# Patient Record
Sex: Male | Born: 1965 | Race: Black or African American | Hispanic: No | Marital: Married | Smoking: Never smoker
Health system: Southern US, Community
[De-identification: ages and names within clinical notes are randomized; demographics above are authoritative.]

## PROBLEM LIST (undated history)

## (undated) DIAGNOSIS — E7800 Pure hypercholesterolemia, unspecified: Secondary | ICD-10-CM

---

## 2015-03-20 ENCOUNTER — Emergency Department (HOSPITAL_COMMUNITY)
Admission: EM | Admit: 2015-03-20 | Discharge: 2015-03-20 | Disposition: A | Discharge status: Home / Self Care | Payer: BLUE CROSS/BLUE SHIELD | Attending: Emergency Medicine | Admitting: Emergency Medicine

## 2015-03-20 ENCOUNTER — Emergency Department (HOSPITAL_COMMUNITY)
Admit: 2015-03-20 | Discharge: 2015-03-20 | Disposition: A | Discharge status: Home / Self Care | Payer: BLUE CROSS/BLUE SHIELD | Attending: Emergency Medicine | Admitting: Emergency Medicine

## 2015-03-20 ENCOUNTER — Emergency Department (HOSPITAL_BASED_OUTPATIENT_CLINIC_OR_DEPARTMENT_OTHER)
Admit: 2015-03-20 | Discharge: 2015-03-20 | Disposition: A | Discharge status: Home / Self Care | Payer: BLUE CROSS/BLUE SHIELD | Attending: Emergency Medicine | Admitting: Emergency Medicine

## 2015-03-20 ENCOUNTER — Emergency Department (HOSPITAL_COMMUNITY): Payer: BLUE CROSS/BLUE SHIELD

## 2015-03-20 ENCOUNTER — Encounter (HOSPITAL_COMMUNITY): Payer: Self-pay | Admitting: Emergency Medicine

## 2015-03-20 DIAGNOSIS — R2232 Localized swelling, mass and lump, left upper limb: Secondary | ICD-10-CM | POA: Diagnosis not present

## 2015-03-20 DIAGNOSIS — E782 Mixed hyperlipidemia: Secondary | ICD-10-CM | POA: Diagnosis not present

## 2015-03-20 DIAGNOSIS — Z79899 Other long term (current) drug therapy: Secondary | ICD-10-CM | POA: Diagnosis not present

## 2015-03-20 DIAGNOSIS — M7989 Other specified soft tissue disorders: Secondary | ICD-10-CM | POA: Diagnosis not present

## 2015-03-20 DIAGNOSIS — R079 Chest pain, unspecified: Secondary | ICD-10-CM | POA: Diagnosis not present

## 2015-03-20 HISTORY — DX: Pure hypercholesterolemia, unspecified: E78.00

## 2015-03-20 LAB — BASIC METABOLIC PANEL
ANION GAP: 7 (ref 5–15)
BUN: 8 mg/dL (ref 6–20)
CHLORIDE: 104 mmol/L (ref 101–111)
CO2: 27 mmol/L (ref 22–32)
Calcium: 8.9 mg/dL (ref 8.9–10.3)
Creatinine, Ser: 1.23 mg/dL (ref 0.61–1.24)
GFR calc Af Amer: >60 mL/min (ref >60)
GFR calc non Af Amer: >60 mL/min (ref >60)
GLUCOSE: 105 mg/dL — AB (ref 65–99)
Potassium: 3.6 mmol/L (ref 3.5–5.1)
Sodium: 138 mmol/L (ref 135–145)

## 2015-03-20 LAB — CBC
HCT: 41.3 % (ref 39.0–52.0)
HEMOGLOBIN: 14.1 g/dL (ref 13.0–17.0)
MCH: 29.7 pg (ref 26.0–34.0)
MCHC: 34.1 g/dL (ref 30.0–36.0)
MCV: 87.1 fL (ref 78.0–100.0)
PLATELETS: 239 10*3/uL (ref 150–400)
RBC: 4.74 MIL/uL (ref 4.22–5.81)
RDW: 13.3 % (ref 11.5–15.5)
WBC: 2.7 10*3/uL — ABNORMAL LOW (ref 4.0–10.5)

## 2015-03-20 LAB — I-STAT TROPONIN, ED: Troponin i, poc: 0 ng/mL (ref 0.00–0.08)

## 2015-03-20 LAB — D-DIMER, QUANTITATIVE (NOT AT ARMC): D-Dimer, Quant: 0.5 ug/mL-FEU — ABNORMAL HIGH (ref 0.00–0.48)

## 2015-03-20 MED ORDER — IOHEXOL 350 MG/ML SOLN
100.0000 mL | Freq: Once | INTRAVENOUS | Status: AC | PRN
  Administered 2015-03-20: 70 mL via INTRAVENOUS

## 2015-03-20 NOTE — ED Provider Notes (Signed)
PROGRESS NOTE                                                                                                                 This is a sign-out fromPA hedges at shift change: Micheal Tanner is a 49 y.o. male presenting with left arm swelling and chest pain. Plan is to follow up CT to rule out PE. Patient had vascular ultrasounds of the left upper extremity and right lower extremity with no DVT, his d-dimer is elevated. Please refer to previous note for full HPI, ROS, PMH and PE.   CT is negative, patient updated on results of care plan, advised him to follow closely with PCP.  Filed Vitals:   03/20/15 1300 03/20/15 1311 03/20/15 1443 03/20/15 1600  BP:   136/90 147/90  Pulse: 73 74 77 68  Temp:      TempSrc:      Resp: Height:      Weight:      SpO2: 99% 98% 98% 99%    Medications  iohexol (OMNIPAQUE) 350 MG/ML injection 100 mL (70 mLs Intravenous Contrast Given 03/20/15 1731)     Evaluation does not show pathology that would require ongoing emergent intervention or inpatient treatment. Pt is hemodynamically stable and mentating appropriately. Discussed findings and plan with patient/guardian, who agrees with care plan. All questions answered. Return precautions discussed and outpatient follow up given.        Wynetta Emery, PA-C 03/20/15 1946  Pricilla Loveless, MD 03/22/15 [phone]

## 2015-03-20 NOTE — Discharge Instructions (Signed)
Peripheral Edema You have swelling in your legs (peripheral edema). This swelling is due to excess accumulation of salt and water in your body. Edema may be a sign of heart, kidney or liver disease, or a side effect of a medication. It may also be due to problems in the leg veins. Elevating your legs and using special support stockings may be very helpful, if the cause of the swelling is due to poor venous circulation. Avoid long periods of standing, whatever the cause. Treatment of edema depends on identifying the cause. Chips, pretzels, pickles and other salty foods should be avoided. Restricting salt in your diet is almost always needed. Water pills (diuretics) are often used to remove the excess salt and water from your body via urine. These medicines prevent the kidney from reabsorbing sodium. This increases urine flow. Diuretic treatment may also result in lowering of potassium levels in your body. Potassium supplements may be needed if you have to use diuretics daily. Daily weights can help you keep track of your progress in clearing your edema. You should call your caregiver for follow up care as recommended. SEEK IMMEDIATE MEDICAL CARE IF:   You have increased swelling, pain, redness, or heat in your legs.  You develop shortness of breath, especially when lying down.  You develop chest or abdominal pain, weakness, or fainting.  You have a fever. Document Released: 09/11/2004 Document Revised: 10/27/2011 Document Reviewed: 08/22/2009 [facility] Patient Information 2015 [place], [place]. This information is not intended to replace advice given to you by your health care provider. Make sure you discuss any questions you have with your health care provider.  Please monitor for new or worsening signs or symptoms, return immediately if any present. Please follow-up with her primary care provider or orthopedist for further evaluation if symptoms continue to persist.

## 2015-03-20 NOTE — Progress Notes (Signed)
*  Preliminary Results* Left upper extremity venous duplex completed. Left upper extremity is negative for deep and superficial vein thrombosis.   Right lower extremity venous duplex completed. Right lower extremity is negative for deep vein thrombosis. There is no evidence of right Baker's cyst.  03/20/2015 2:32 PM  Gertie Fey, RVT, RDCS, RDMS

## 2015-03-20 NOTE — ED Provider Notes (Signed)
CSN: [id number]     Arrival date & time 03/20/15  1036 History   First MD Initiated Contact with Patient 03/20/15 1051     Chief Complaint  Patient presents with  . Chest Pain  . Arm Swelling   HPI   49 year old male presents today with left upper extremity swelling, and chest pain. Patient reports symptoms began on Sunday, no known causative event. Patient reports the arm has been calm swollen greater than the right, and has an associated chest pain. He describes the chest pain as pressure in the left anterior chest, not made worse by movement, no diaphoresis, no nausea, no vomiting, no shortness of breath. Denies any history of the same. Patient reports that he has no history of DVT, PE, does not smoke, has not had prolonged immobilization, no steroid or estrogen use, no history of malignancy, no recent surgeries. Patient denies any significant cardiac history, reports he does have high cholesterol taking medication for this, no hypertension, no diabetes, nonsmoker. Patient does report that his father had a heart attack at an early age, reports that he was a smoker, hypertensive.     Past Medical History  Diagnosis Date  . High cholesterol    History reviewed. No pertinent past surgical history. No family history on file. History  Substance Use Topics  . Smoking status: Never Smoker   . Smokeless tobacco: Not on file  . Alcohol Use: Yes     Comment: occassionally    Review of Systems  All other systems reviewed and are negative.  Allergies  Review of patient's allergies indicates no known allergies.  Home Medications   Prior to Admission medications   Medication Sig Start Date End Date Taking? Authorizing Provider  atorvastatin (LIPITOR) 20 MG tablet Take 20 mg by mouth daily.   Yes Historical Provider, MD  cetirizine (ZYRTEC) 10 MG tablet Take 10 mg by mouth as needed for allergies.   Yes Historical Provider, MD  naproxen sodium (ANAPROX) 220 MG tablet Take 440 mg by mouth as  needed (pain).   Yes Historical Provider, MD   BP 147/90 mmHg  Pulse 68  Temp(Src) 98.7 F (37.1 C) (Oral)  Resp 12  Ht 6\' 2"  (1.88 m)  Wt 200 lb (90.719 kg)  BMI 25.67 kg/m2  SpO2 99%   Physical Exam  Constitutional: He is oriented to person, place, and time. He appears well-developed and well-nourished.  HENT:  Head: Normocephalic and atraumatic.  Eyes: Conjunctivae are normal. Pupils are equal, round, and reactive to light. Right eye exhibits no discharge. Left eye exhibits no discharge. No scleral icterus.  Neck: Normal range of motion. Neck supple. No JVD present. No tracheal deviation present.  Cardiovascular: Normal rate, regular rhythm, normal heart sounds and intact distal pulses.  Exam reveals no gallop and no friction rub.   No murmur heard. Pulmonary/Chest: Effort normal and breath sounds normal. No stridor. No respiratory distress. He has no wheezes. He has no rales. He exhibits no tenderness.  Musculoskeletal: He exhibits edema.  Left upper extremity slightly larger than the right with edema. Patient has nontender to palpation, full active range of motion of the shoulder or elbow and wrist, pain free, Refill less than 3 seconds, radial pulses 2+, sensation grossly intact.  Tenderness to palpation of the right hamstring, no obvious swelling.  Bilateral lower extremities equal, no swelling or edema, sensation grossly intact.  Neurological: He is alert and oriented to person, place, and time. Coordination normal.  Skin: Skin is warm  and dry.  Psychiatric: He has a normal mood and affect. His behavior is normal. Judgment and thought content normal.  Nursing note and vitals reviewed.   ED Course  Procedures (including critical care time) Labs Review Labs Reviewed  BASIC METABOLIC PANEL - Abnormal; Notable for the following:    Glucose, Bld 105 (*)    All other components within normal limits  CBC - Abnormal; Notable for the following:    WBC 2.7 (*)    All other  components within normal limits  D-DIMER, QUANTITATIVE (NOT AT [facility]) - Abnormal; Notable for the following:    D-Dimer, Quant 0.50 (*)    All other components within normal limits  I-STAT TROPOININ, ED    Imaging Review Dg Chest 2 View  03/20/2015   CLINICAL DATA:  Chest pain for 3 days.  Left-sided pressure.  EXAM: CHEST - 2 VIEW  COMPARISON:  None.  FINDINGS: The heart size and mediastinal contours are within normal limits. Both lungs are clear. The visualized skeletal structures are unremarkable.  IMPRESSION: Negative two view chest x-ray   Electronically Signed   By: Marin Roberts M.D.   On: 03/20/2015 11:57     EKG Interpretation   Date/Time:  Tuesday March 20 2015 10:43:17 EDT Ventricular Rate:  83 PR Interval:  146 QRS Duration: 81 QT Interval:  353 QTC Calculation: 415 R Axis:   28 Text Interpretation:  Sinus rhythm Abnormal R-wave progression, early  transition Borderline repolarization abnormality No old tracing to compare  Confirmed by GOLDSTON  MD, SCOTT ([id number]) on 03/20/2015 11:45:59 AM      MDM   Final diagnoses:  Arm swelling    Labs: D-dimer, BMP, CBC, i-STAT troponin- d-dimer 0.50  Imaging: Venous ultrasound left upper extremity, right lower extremity, CT PE- no clots on ultrasound CT pending  Consults:  Therapeutics: Patient offered pain medication, refused  Discharge Meds:   Assessment/Plan: Patient presents with chest pain, left arm swelling. Patient's only Wells criteria positive is left upper extremity swelling, ultrasound shows no clot. Patient did have an elevated d-dimer here today, discussion of risks and benefits with both the patient and his wife with agreement to CT PE at this time. Uncertain etiology of left upper extremity swelling and edema, no signs of clot. Patient reports that he did work out approximately week ago, lifted weights and upper body, this is abnormal exercise routine for him. He denies upper musculoskeletal pain. Patient  will receive a CT scan here in the ED and signed out to  mid-level provider Joni Reining Pisciotta PA-C pending CT results. Patient reports that his chest pain has resolved after passing gas. Patient has a EKG that is not consistent with MI, negative troponin, heart score of 1, highly unlikely to be ACS. I've discussed treatment plan with the patient, if CT is negative, he will be discharged home with instructions to follow-up with his primary care provider, an orthopedist if symptoms continue to persist. Patient was given strict return precautions the event new worsening signs or symptoms present, he verbalized understanding and agreement to display splint has no further questions or concerns.      Eyvonne Mechanic, PA-C 03/20/15 1623  Eyvonne Mechanic, PA-C 03/20/15 1638  Pricilla Loveless, MD 03/22/15 [phone]

## 2015-03-20 NOTE — ED Notes (Signed)
Patient off the unit for procedure.

## 2015-03-20 NOTE — ED Notes (Signed)
Patient transported to CT 

## 2015-03-20 NOTE — ED Notes (Signed)
Cp x 3 days, first noticed at rest, left sided pressure non radiating with no associated symptoms.  Pain is rated at a 5/10 in severity,  It is constant and described as a pressure.  States it is better with sleep or rest.  Not reproduced with palpation.  Also has had right arm swelling for about the same time, as well as left hamstring pain.  Also c/o of headaches.  These all started 3 days ago.  Yesterday the patient flew here from [place].   VSS, pulses intact and equal bilaterally.

## 2016-06-29 IMAGING — CT CT ANGIO CHEST
2 of 8 series · 18 of 36 positions shown · IV contrast (Iohexol (Omnipaque 350))
Comparison: Chest x-ray 03/20/2015

CLINICAL DATA: Left arm swelling for 2-3 days. Evaluate for blood
clot.

EXAM:
CT ANGIOGRAPHY CHEST WITH CONTRAST
TECHNIQUE: Multidetector CT imaging of the chest was performed using the
standard protocol during bolus administration of intravenous
contrast. Multiplanar CT image reconstructions and MIPs were
obtained to evaluate the vascular anatomy.
CONTRAST:  70mL OMNIPAQUE IOHEXOL 350 MG/ML SOLN

[Series 402: thins pacs · axial · 0.68mm/px · z∈[-487,-213]mm · 17 of 310 slices shown]
[im 18/310  lung]
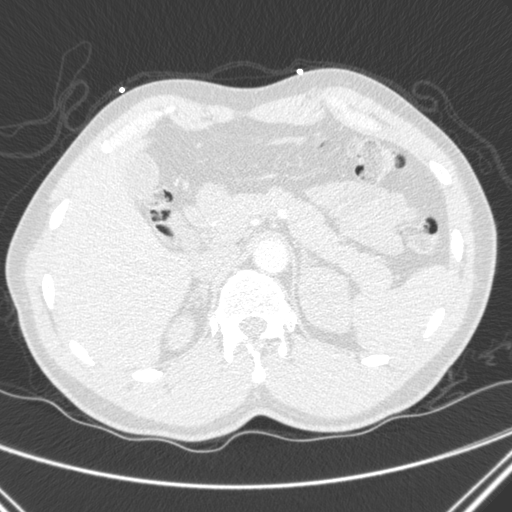
[im 35/310  mediastinal]
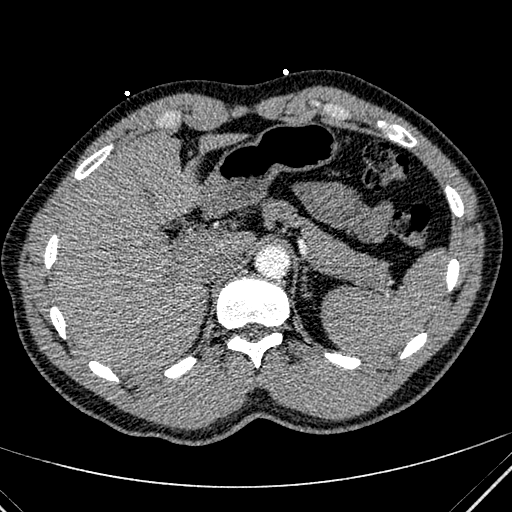
[im 52/310  lung]
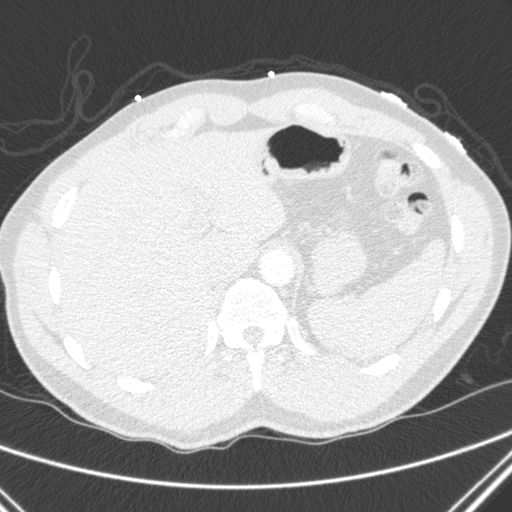
[im 69/310  mediastinal]
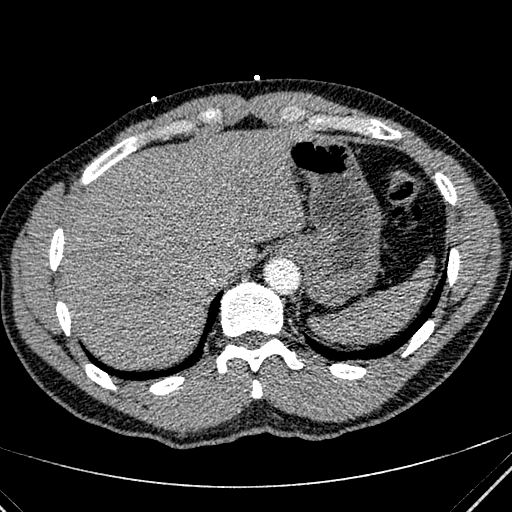
[im 86/310  lung]
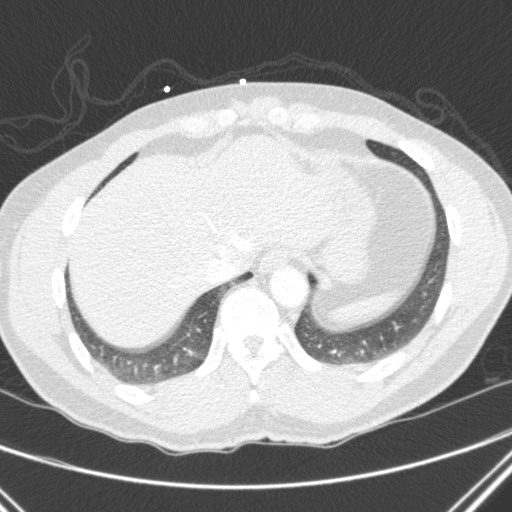
[im 104/310  mediastinal]
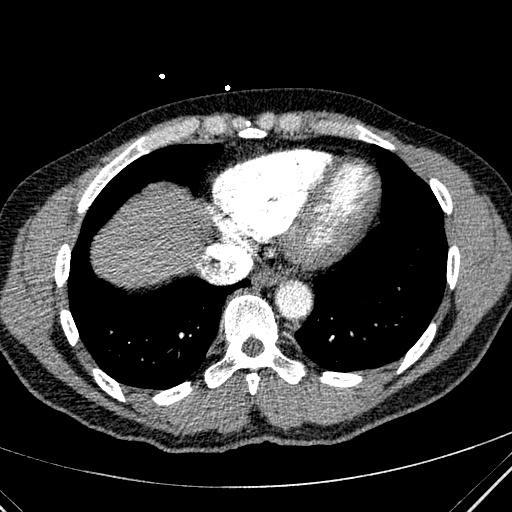
[im 121/310  lung]
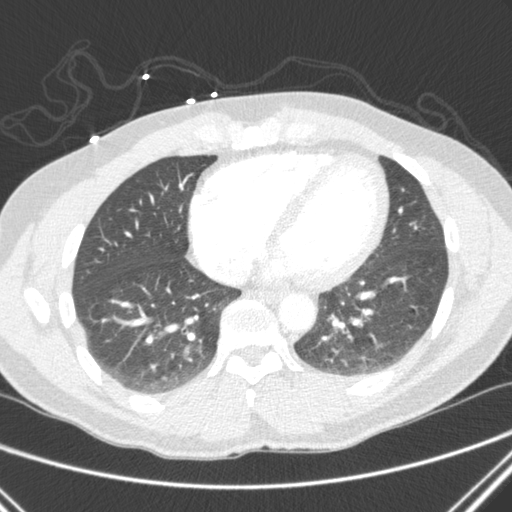
[im 138/310  mediastinal]
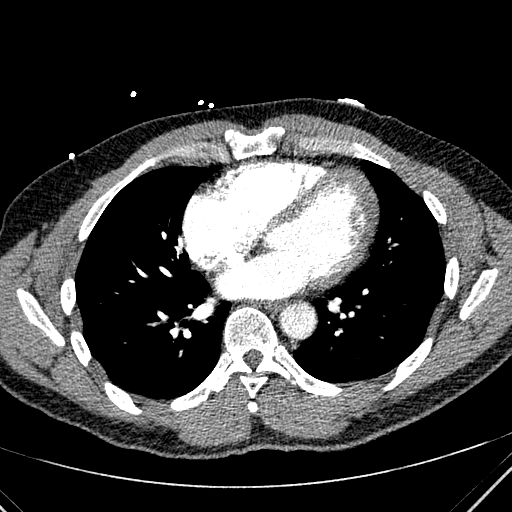
[im 155/310  lung]
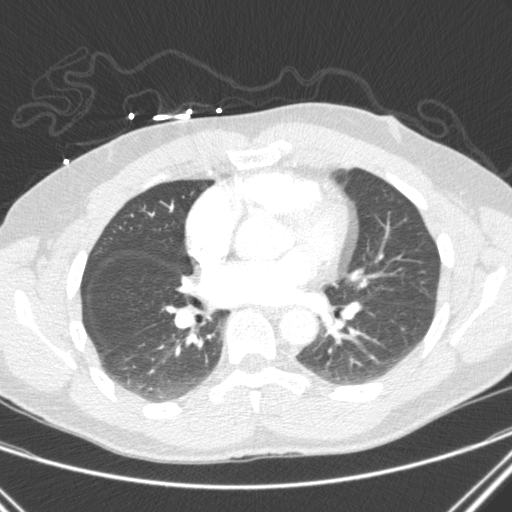
[im 172/310  mediastinal]
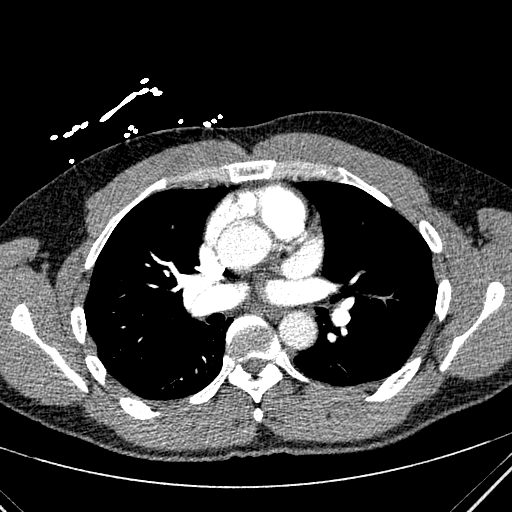
[im 189/310  lung]
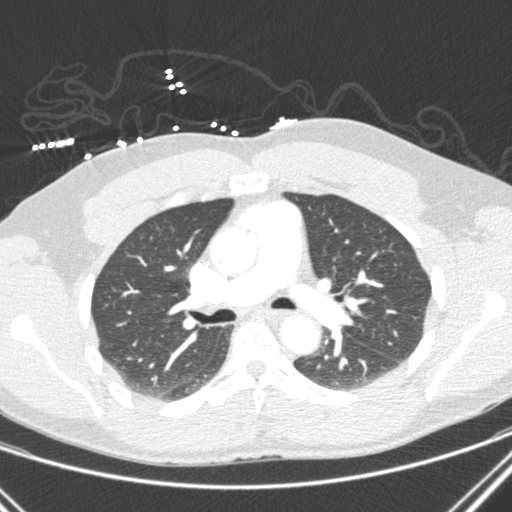
[im 207/310  mediastinal]
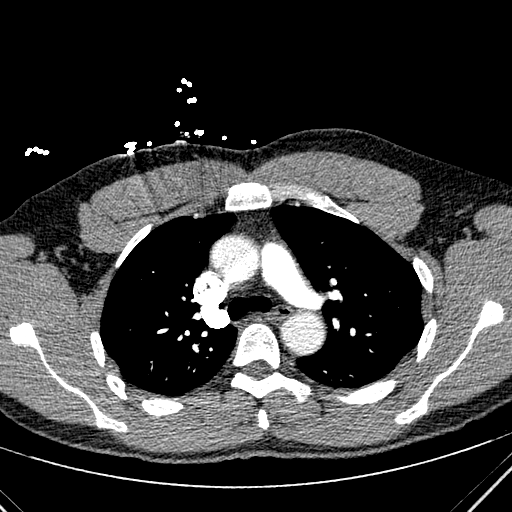
[im 224/310  lung]
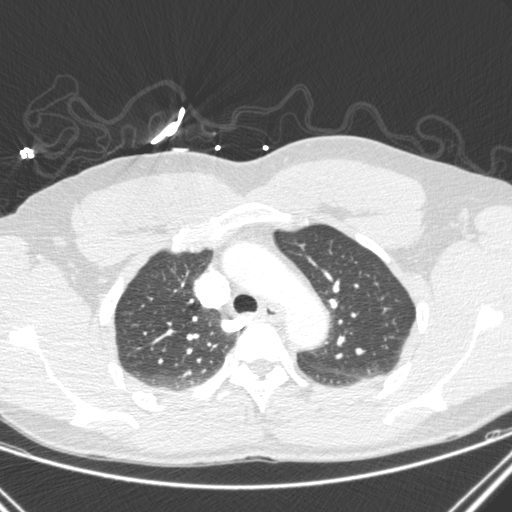
[im 241/310  mediastinal]
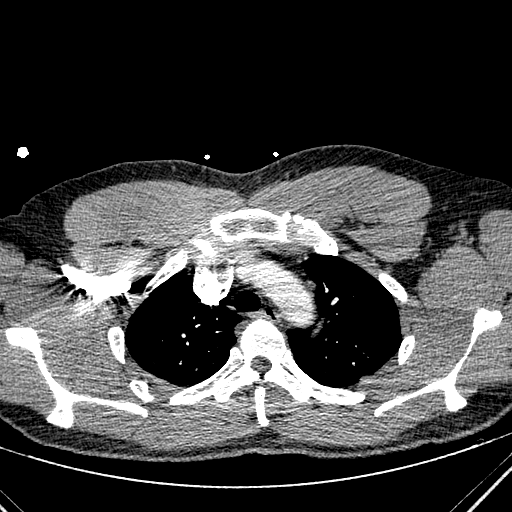
[im 258/310  lung]
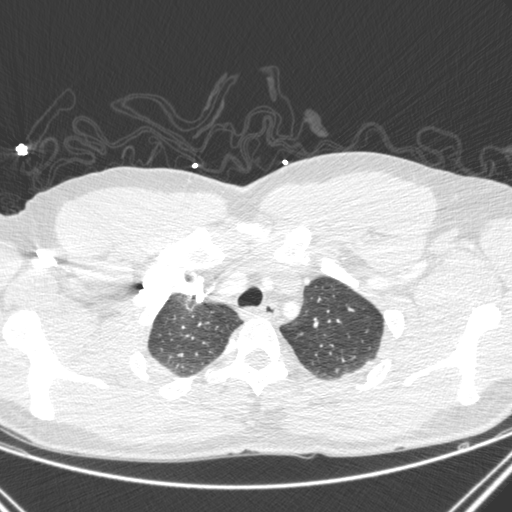
[im 275/310  mediastinal]
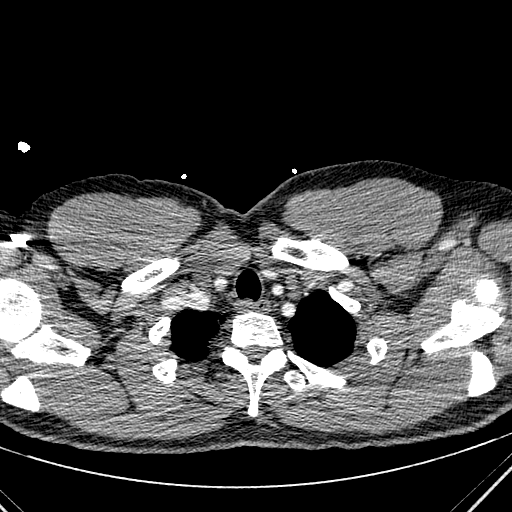
[im 292/310  lung]
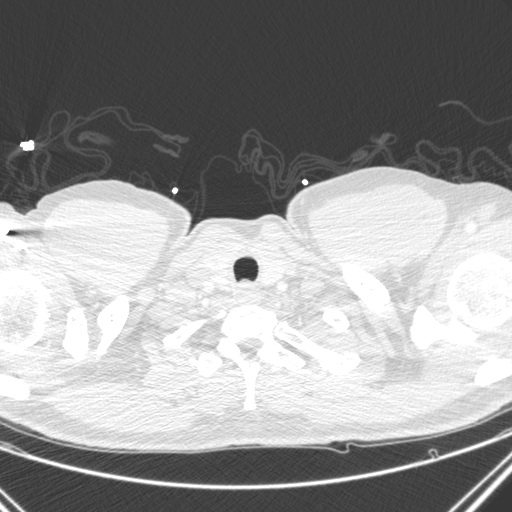

[Series 404: cor · coronal · 0.68mm/px · 1 of 124 slices shown]
[im 62/124  mediastinal]
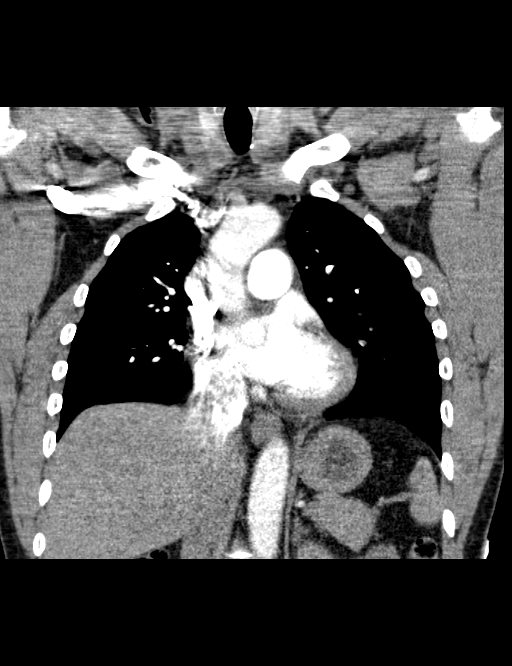

[18 of 36 positions shown; findings below may reference images not displayed]

FINDINGS: Heart: Heart size is normal. No imaged pericardial effusion or
significant coronary artery calcifications.

Vascular structures: Pulmonary arteries are well opacified. There is
no evidence for acute pulmonary embolus. Normal appearing aortic
arch. No aneurysm.

Mediastinum/thyroid: The visualized portion of the thyroid gland has
a normal appearance. R No mediastinal, hilar, or axillary
adenopathy.

Lungs/Airways: No pulmonary nodules, pleural effusions, or
infiltrates. Airways are patent.

Upper abdomen: The gallbladder is present.

Chest wall/osseous structures: Unremarkable.

Review of the MIP images confirms the above findings.
IMPRESSION: 1. Technically adequate exam showing no acute pulmonary embolus.
2.  No evidence for acute  abnormality.
3. Study was performed for optimal evaluation of pulmonary arteries.
Study is not performed to evaluate integrity/patency of the axillary
vein.

## 2016-06-29 IMAGING — DX DG CHEST 2V
2 series · 2 of 2 positions shown · non-contrast
Comparison: None.

CLINICAL DATA: Chest pain for 3 days.  Left-sided pressure.

EXAM:
CHEST - 2 VIEW

[w chest pa]
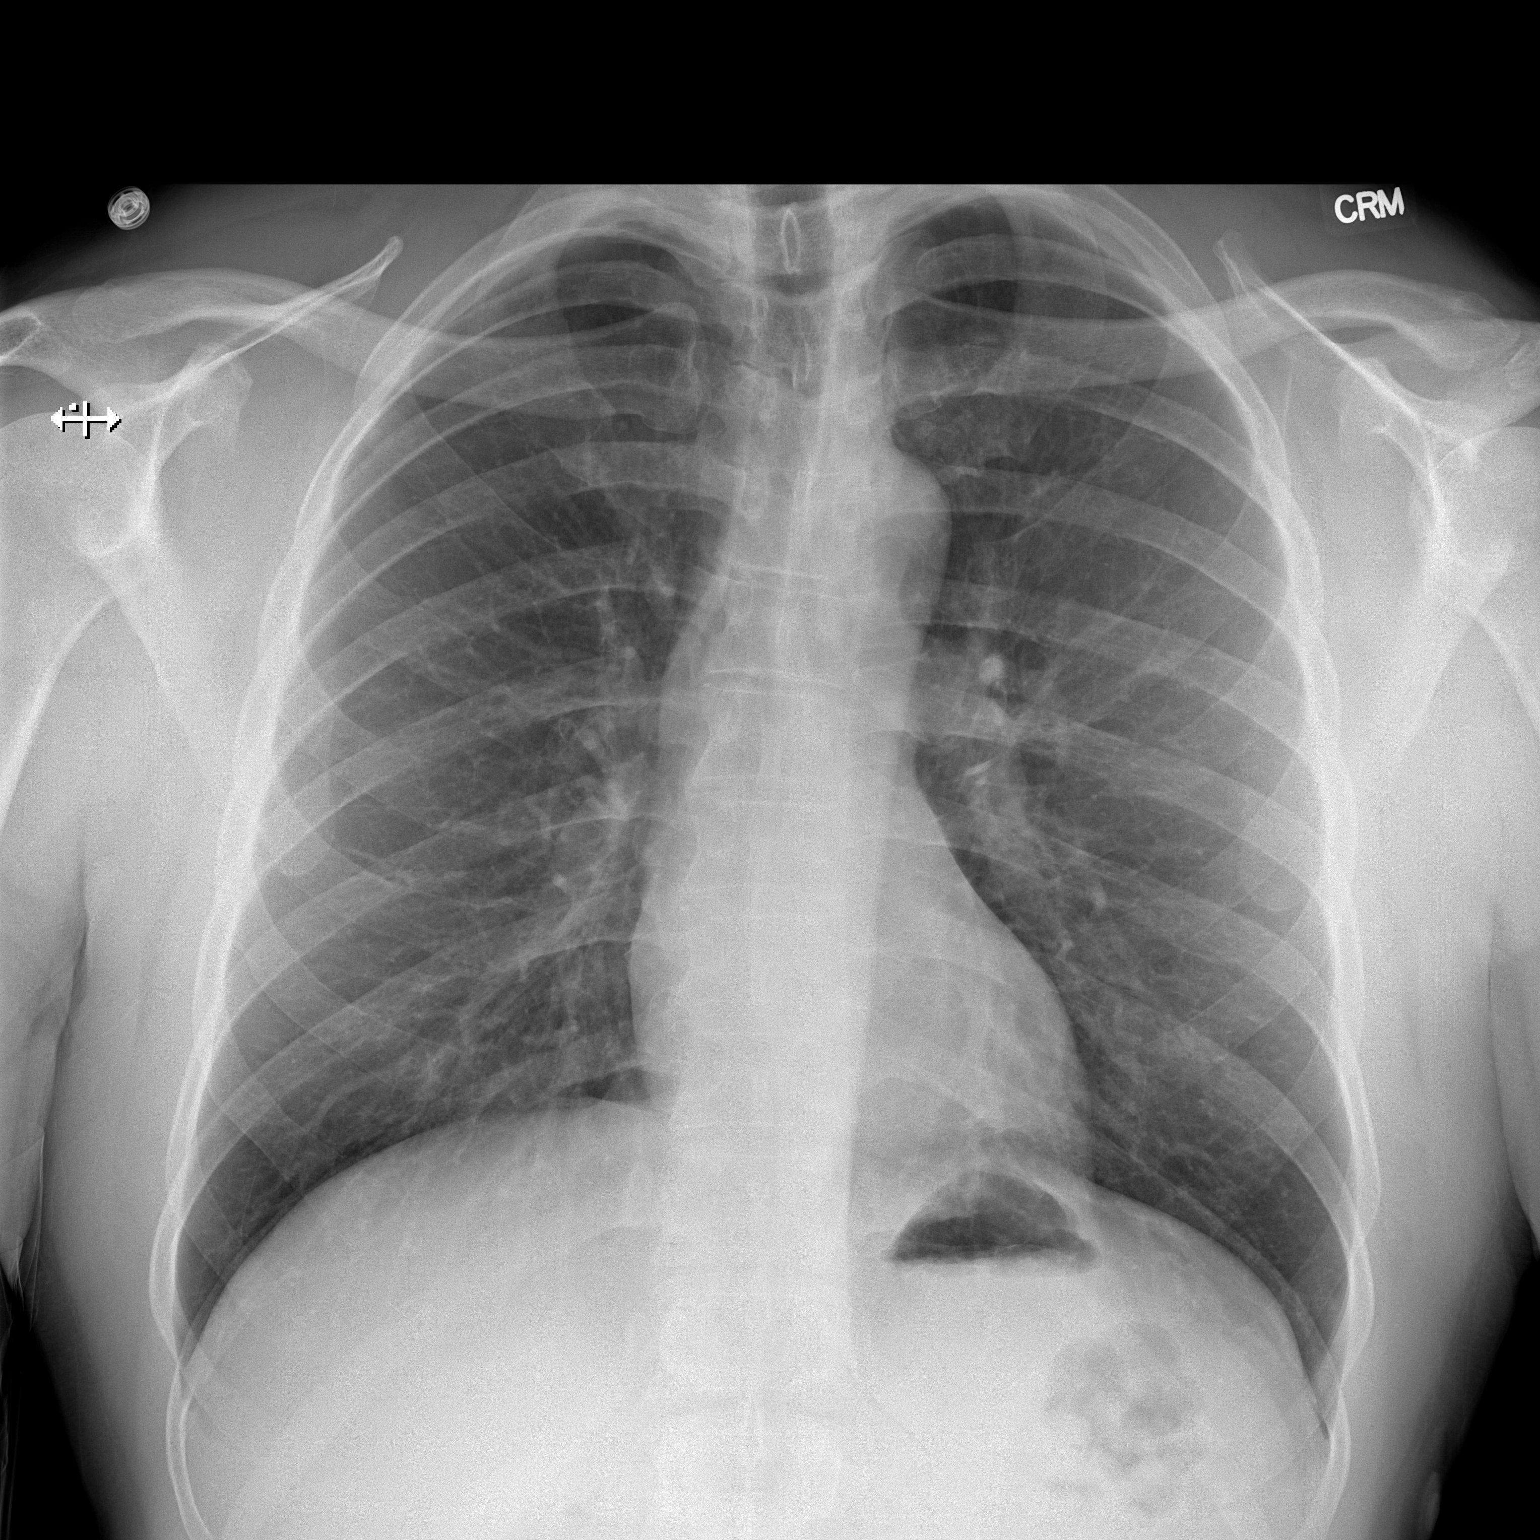

[w chest lat]
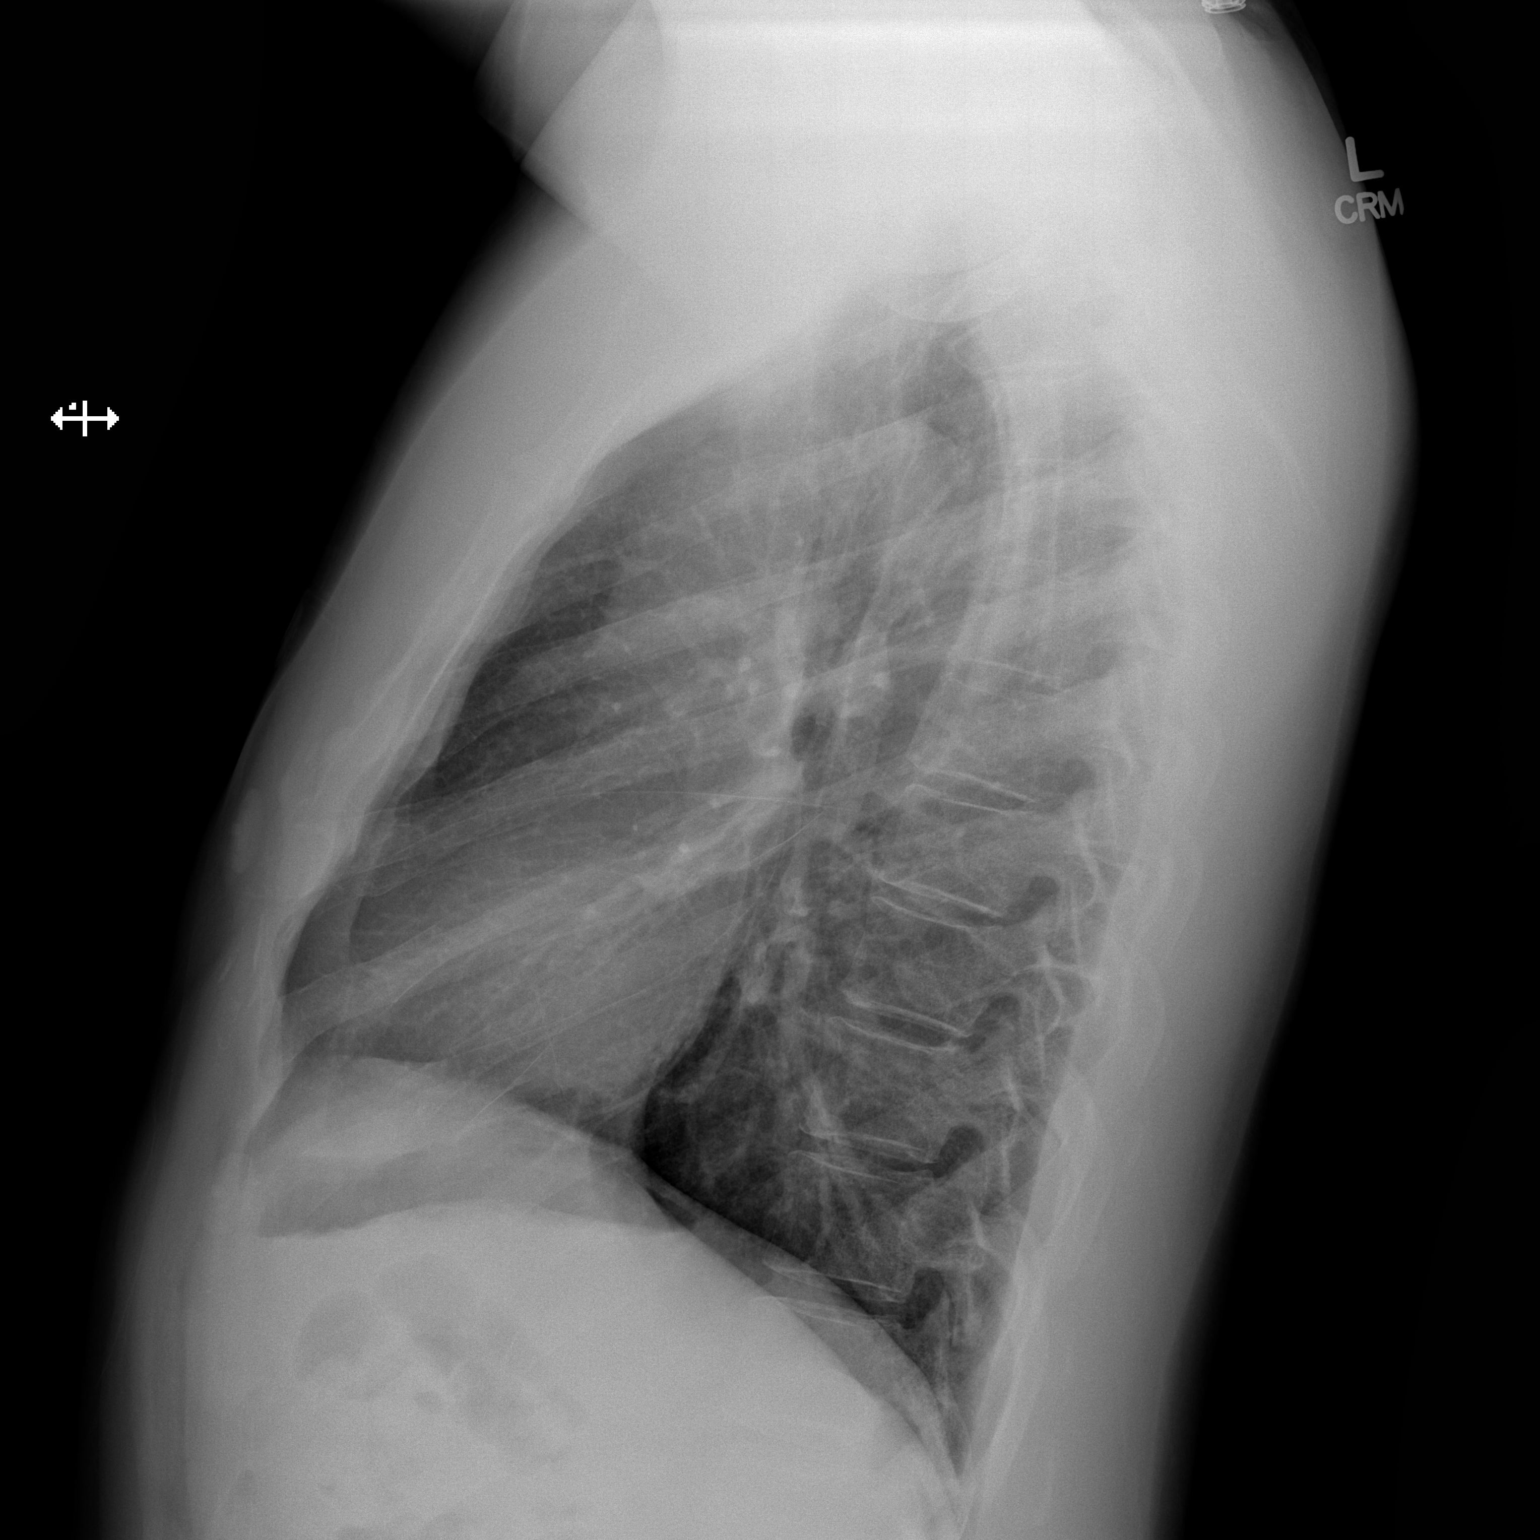

[2 of 2 positions shown; findings below may reference images not displayed]

FINDINGS: The heart size and mediastinal contours are within normal limits.
Both lungs are clear. The visualized skeletal structures are
unremarkable.
IMPRESSION: Negative two view chest x-ray
